# Patient Record
Sex: Female | Born: 1975 | Race: Black or African American | Hispanic: No | Marital: Single | Smoking: Never smoker
Health system: Southern US, Community
[De-identification: ages and names within clinical notes are randomized; demographics above are authoritative.]

## PROBLEM LIST (undated history)

## (undated) DIAGNOSIS — E119 Type 2 diabetes mellitus without complications: Secondary | ICD-10-CM

## (undated) HISTORY — PX: BACK SURGERY: SHX140

---

## 1999-12-30 ENCOUNTER — Inpatient Hospital Stay (HOSPITAL_COMMUNITY): Admission: AD | Admit: 1999-12-30 | Discharge: 1999-12-30 | Payer: Self-pay | Admitting: *Deleted

## 2000-01-12 ENCOUNTER — Encounter: Payer: Self-pay | Admitting: Obstetrics & Gynecology

## 2000-01-12 ENCOUNTER — Inpatient Hospital Stay (HOSPITAL_COMMUNITY): Admission: AD | Admit: 2000-01-12 | Discharge: 2000-01-12 | Payer: Self-pay | Admitting: *Deleted

## 2000-01-12 ENCOUNTER — Inpatient Hospital Stay (HOSPITAL_COMMUNITY): Admission: AD | Admit: 2000-01-12 | Discharge: 2000-01-12 | Payer: Self-pay | Admitting: Obstetrics & Gynecology

## 2000-01-27 ENCOUNTER — Other Ambulatory Visit: Admission: RE | Admit: 2000-01-27 | Discharge: 2000-01-27 | Payer: Self-pay | Admitting: Obstetrics and Gynecology

## 2000-02-06 ENCOUNTER — Inpatient Hospital Stay (HOSPITAL_COMMUNITY): Admission: AD | Admit: 2000-02-06 | Discharge: 2000-02-06 | Payer: Self-pay | Admitting: Obstetrics and Gynecology

## 2000-05-28 ENCOUNTER — Inpatient Hospital Stay (HOSPITAL_COMMUNITY): Admission: AD | Admit: 2000-05-28 | Discharge: 2000-05-28 | Payer: Self-pay | Admitting: Obstetrics and Gynecology

## 2000-05-31 ENCOUNTER — Ambulatory Visit (HOSPITAL_COMMUNITY): Admission: RE | Admit: 2000-05-31 | Discharge: 2000-05-31 | Payer: Self-pay | Admitting: Obstetrics and Gynecology

## 2000-05-31 ENCOUNTER — Encounter: Payer: Self-pay | Admitting: Obstetrics and Gynecology

## 2000-07-05 ENCOUNTER — Inpatient Hospital Stay (HOSPITAL_COMMUNITY): Admission: AD | Admit: 2000-07-05 | Discharge: 2000-07-05 | Payer: Self-pay | Admitting: Obstetrics and Gynecology

## 2000-07-07 ENCOUNTER — Inpatient Hospital Stay (HOSPITAL_COMMUNITY): Admission: AD | Admit: 2000-07-07 | Discharge: 2000-07-09 | Payer: Self-pay | Admitting: Obstetrics and Gynecology

## 2001-07-30 ENCOUNTER — Emergency Department (HOSPITAL_COMMUNITY): Admission: EM | Admit: 2001-07-30 | Discharge: 2001-07-30 | Payer: Self-pay | Admitting: Emergency Medicine

## 2002-10-17 ENCOUNTER — Emergency Department (HOSPITAL_COMMUNITY): Admission: EM | Admit: 2002-10-17 | Discharge: 2002-10-17 | Payer: Self-pay | Admitting: Emergency Medicine

## 2002-10-18 ENCOUNTER — Encounter: Payer: Self-pay | Admitting: Emergency Medicine

## 2002-11-23 ENCOUNTER — Emergency Department (HOSPITAL_COMMUNITY): Admission: EM | Admit: 2002-11-23 | Discharge: 2002-11-23 | Payer: Self-pay | Admitting: Emergency Medicine

## 2005-04-20 ENCOUNTER — Emergency Department (HOSPITAL_COMMUNITY): Admission: EM | Admit: 2005-04-20 | Discharge: 2005-04-20 | Payer: Self-pay | Admitting: Family Medicine

## 2006-04-17 ENCOUNTER — Emergency Department (HOSPITAL_COMMUNITY): Admission: EM | Admit: 2006-04-17 | Discharge: 2006-04-17 | Payer: Self-pay | Admitting: Family Medicine

## 2007-07-01 ENCOUNTER — Emergency Department (HOSPITAL_COMMUNITY): Admission: EM | Admit: 2007-07-01 | Discharge: 2007-07-01 | Payer: Self-pay | Admitting: *Deleted

## 2008-07-30 ENCOUNTER — Ambulatory Visit (HOSPITAL_COMMUNITY): Admission: RE | Admit: 2008-07-30 | Discharge: 2008-08-01 | Payer: Self-pay | Admitting: Orthopedic Surgery

## 2008-07-30 ENCOUNTER — Encounter (INDEPENDENT_AMBULATORY_CARE_PROVIDER_SITE_OTHER): Payer: Self-pay | Admitting: Orthopedic Surgery

## 2009-02-05 ENCOUNTER — Emergency Department (HOSPITAL_COMMUNITY): Admission: EM | Admit: 2009-02-05 | Discharge: 2009-02-05 | Payer: Self-pay | Admitting: Family Medicine

## 2009-02-10 ENCOUNTER — Emergency Department (HOSPITAL_COMMUNITY): Admission: EM | Admit: 2009-02-10 | Discharge: 2009-02-10 | Payer: Self-pay | Admitting: Emergency Medicine

## 2009-02-17 ENCOUNTER — Emergency Department (HOSPITAL_COMMUNITY): Admission: EM | Admit: 2009-02-17 | Discharge: 2009-02-17 | Payer: Self-pay | Admitting: Emergency Medicine

## 2009-12-17 ENCOUNTER — Encounter: Admission: RE | Admit: 2009-12-17 | Discharge: 2009-12-17 | Payer: Self-pay | Admitting: Obstetrics and Gynecology

## 2010-04-11 IMAGING — CT CT CERVICAL SPINE W/O CM
3 of 5 series · 11 of 20 positions shown, 12 images · non-contrast
Comparison: None.

CT HEAD

CLINICAL DATA: Fell - struck right side of head.  Headache with
nausea and vomiting.

CT HEAD WITHOUT CONTRAST
CT CERVICAL SPINE WITHOUT CONTRAST
TECHNIQUE: Multidetector CT imaging of the head and cervical spine
was performed following the standard protocol without intravenous
contrast.  Multiplanar CT image reconstructions of the cervical
spine were also generated.

[Series 7: c_spine 2.0 b31s · axial · 0.23mm/px · z∈[-272,-170]mm · 4 of 85 slices shown]
[im 17/85  bone]
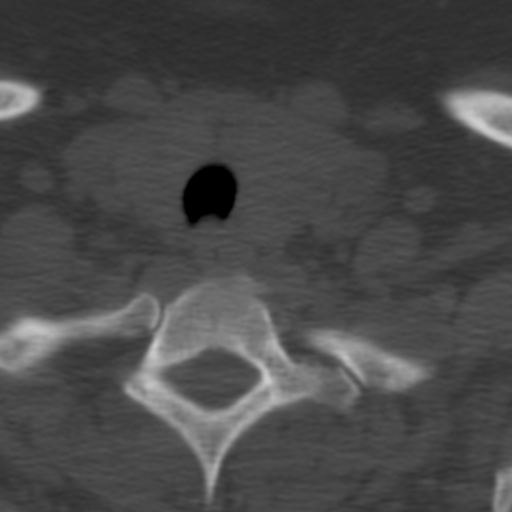
[im 34/85  bone]
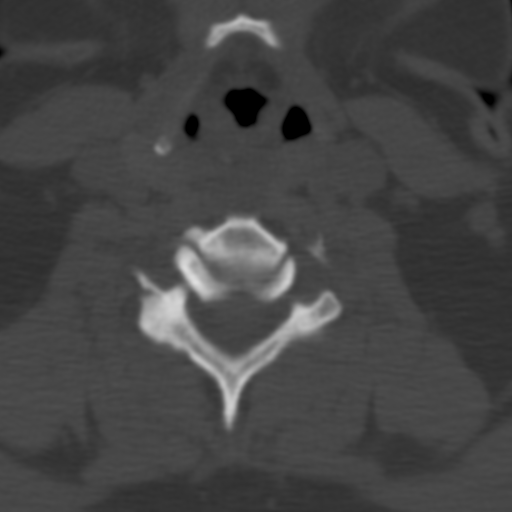
[im 51/85  bone]
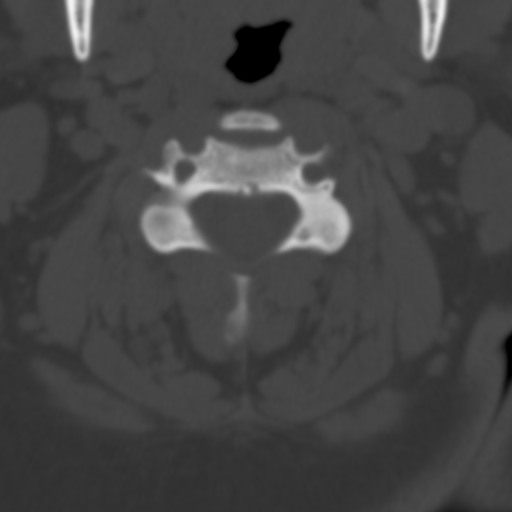
[im 68/85  bone]
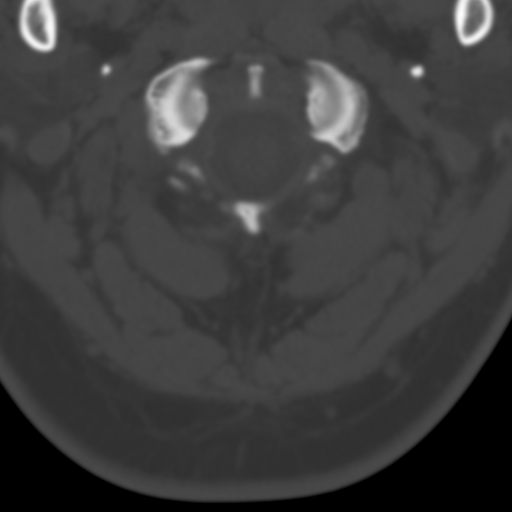

[Series 602: <mpr thick range> · coronal · 0.33mm/px · 3 of 49 slices shown]
[im 10/49  bone]
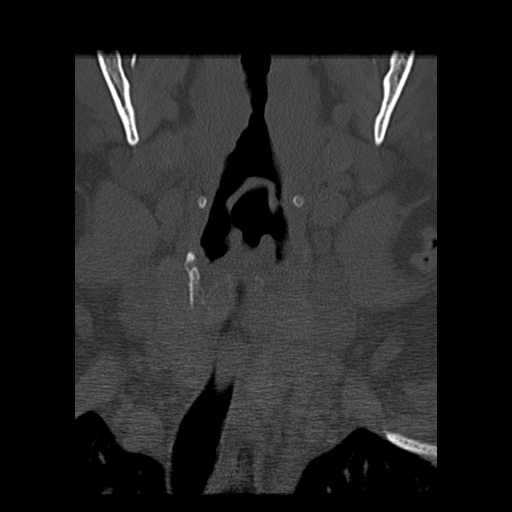
[im 20/49  bone]
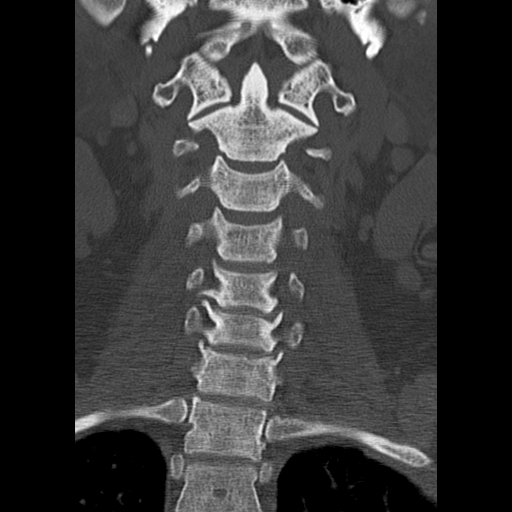
[im 29/49  bone]
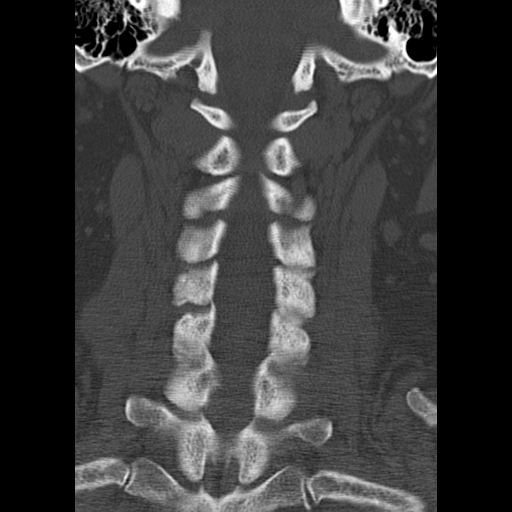

[Series 603: <mpr thick range(1)> · axial · 0.33mm/px · z∈[-321,-233]mm · 4 of 90 slices shown, 5 images]
[im 18/90  soft-tissue]
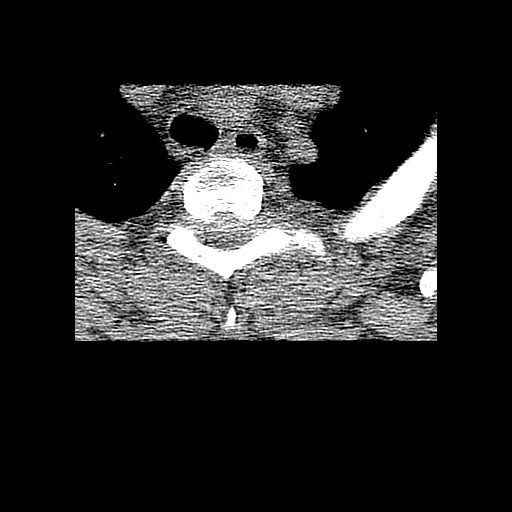
[im 18/90  bone]
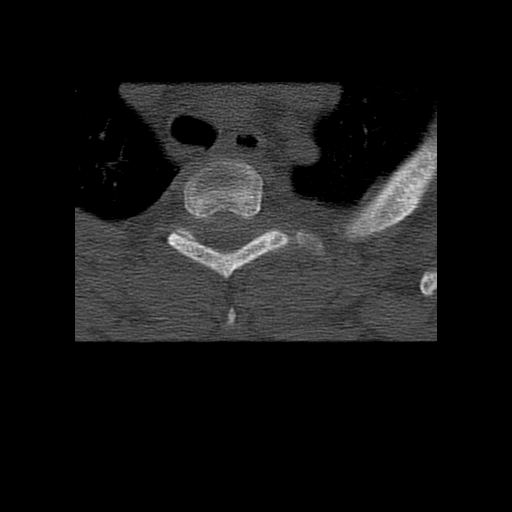
[im 36/90  bone]
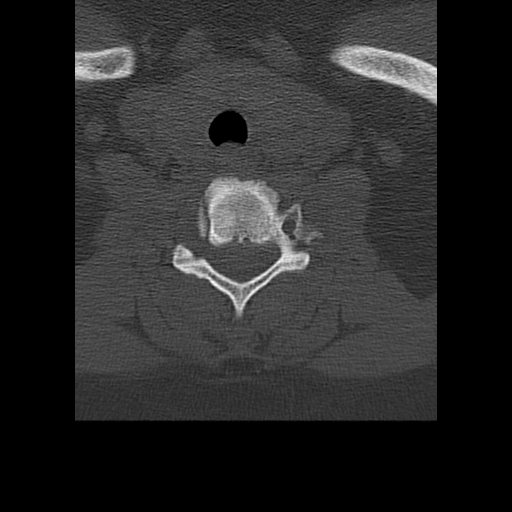
[im 54/90  bone]
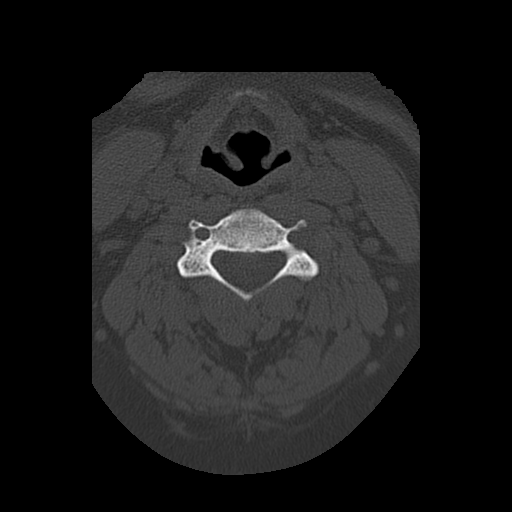
[im 72/90  bone]
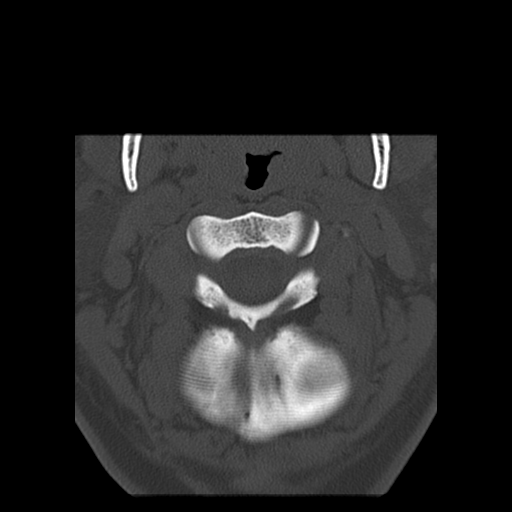

[11 of 20 positions shown; findings below may reference images not displayed]

FINDINGS: Ventricular size and CSF spaces normal.  No evidence for
acute infarct, hemorrhage, or mass lesion. No extra-axial fluid
collections or midline shift.  Calvarium intact.  No fluid in the
sinuses visualized.
IMPRESSION: 1.  No acute or focal intracranial findings.
2.  Retention cyst or polyp of the right sphenoid sinus noted.
There is a retention cyst or polyp in the right sphenoid sinus.

CT CERVICAL SPINE
FINDINGS: There is reversal of the normal lumbar lordotic curve.
There is disc narrowing at C5-6 with degenerative spurs.  No spinal
or foraminal stenosis.

No fractures or subluxation.  Paraspinous soft tissues normal.  And

No obvious acute disc herniation or intraspinal hematoma.
IMPRESSION: Reversal of lordosis and degenerative changes - no acute findings.

## 2010-05-01 LAB — DIFFERENTIAL
Basophils Absolute: 0 10*3/uL (ref 0.0–0.1)
Basophils Relative: 0 % (ref 0–1)
Eosinophils Absolute: 0.3 10*3/uL (ref 0.0–0.7)
Monocytes Relative: 7 % (ref 3–12)
Neutro Abs: 6.1 10*3/uL (ref 1.7–7.7)
Neutrophils Relative %: 59 % (ref 43–77)

## 2010-05-01 LAB — URINE MICROSCOPIC-ADD ON

## 2010-05-01 LAB — POCT I-STAT, CHEM 8
Chloride: 104 mEq/L (ref 96–112)
Glucose, Bld: 115 mg/dL — ABNORMAL HIGH (ref 70–99)
HCT: 38 % (ref 36.0–46.0)
Hemoglobin: 12.9 g/dL (ref 12.0–15.0)
Potassium: 3.7 mEq/L (ref 3.5–5.1)
Sodium: 137 mEq/L (ref 135–145)

## 2010-05-01 LAB — URINALYSIS, ROUTINE W REFLEX MICROSCOPIC
Hgb urine dipstick: NEGATIVE
Protein, ur: NEGATIVE mg/dL
Specific Gravity, Urine: 1.013 (ref 1.005–1.030)
Urobilinogen, UA: 0.2 mg/dL (ref 0.0–1.0)

## 2010-05-01 LAB — CBC
MCHC: 32.7 g/dL (ref 30.0–36.0)
MCV: 78.4 fL (ref 78.0–100.0)
Platelets: 410 10*3/uL — ABNORMAL HIGH (ref 150–400)
RBC: 4.43 MIL/uL (ref 3.87–5.11)
RDW: 16.4 % — ABNORMAL HIGH (ref 11.5–15.5)

## 2010-05-16 LAB — URINALYSIS, ROUTINE W REFLEX MICROSCOPIC
Bilirubin Urine: NEGATIVE
Glucose, UA: NEGATIVE mg/dL
Hgb urine dipstick: NEGATIVE
Ketones, ur: NEGATIVE mg/dL
Protein, ur: NEGATIVE mg/dL

## 2010-05-16 LAB — POCT I-STAT, CHEM 8
BUN: 8 mg/dL (ref 6–23)
BUN: 4 mg/dL — ABNORMAL LOW (ref 6–23)
Calcium, Ion: 1.16 mmol/L (ref 1.12–1.32)
Calcium, Ion: 1.16 mmol/L (ref 1.12–1.32)
Chloride: 106 mEq/L (ref 96–112)
Chloride: 104 mEq/L (ref 96–112)
Creatinine, Ser: 0.3 mg/dL — ABNORMAL LOW (ref 0.4–1.2)
Glucose, Bld: 95 mg/dL (ref 70–99)
Glucose, Bld: 114 mg/dL — ABNORMAL HIGH (ref 70–99)
HCT: 42 % (ref 36.0–46.0)
Potassium: 3.8 mEq/L (ref 3.5–5.1)

## 2010-05-16 LAB — POCT URINALYSIS DIP (DEVICE)
Glucose, UA: NEGATIVE mg/dL
Hgb urine dipstick: NEGATIVE
Protein, ur: NEGATIVE mg/dL
Specific Gravity, Urine: 1.02 (ref 1.005–1.030)

## 2010-05-16 LAB — DIFFERENTIAL
Basophils Relative: 1 % (ref 0–1)
Eosinophils Absolute: 0.3 10*3/uL (ref 0.0–0.7)
Eosinophils Relative: 4 % (ref 0–5)
Lymphs Abs: 3.3 10*3/uL (ref 0.7–4.0)
Monocytes Relative: 9 % (ref 3–12)

## 2010-05-16 LAB — CBC
HCT: 38.2 % (ref 36.0–46.0)
HCT: 35.5 % — ABNORMAL LOW (ref 36.0–46.0)
Hemoglobin: 12.4 g/dL (ref 12.0–15.0)
MCHC: 32.2 g/dL (ref 30.0–36.0)
MCV: 78.8 fL (ref 78.0–100.0)
RBC: 4.84 MIL/uL (ref 3.87–5.11)
RBC: 4.5 MIL/uL (ref 3.87–5.11)
RDW: 17.1 % — ABNORMAL HIGH (ref 11.5–15.5)
WBC: 13.1 10*3/uL — ABNORMAL HIGH (ref 4.0–10.5)
WBC: 9.5 10*3/uL (ref 4.0–10.5)

## 2010-05-16 LAB — TROPONIN I

## 2010-05-16 LAB — CK TOTAL AND CKMB (NOT AT ARMC): Relative Index: 1.3 (ref 0.0–2.5)

## 2010-05-16 LAB — POCT PREGNANCY, URINE

## 2010-05-23 LAB — COMPREHENSIVE METABOLIC PANEL
ALT: 22 U/L (ref 0–35)
AST: 24 U/L (ref 0–37)
Albumin: 3.4 g/dL — ABNORMAL LOW (ref 3.5–5.2)
Alkaline Phosphatase: 82 U/L (ref 39–117)
Chloride: 103 mEq/L (ref 96–112)
Creatinine, Ser: 0.7 mg/dL (ref 0.4–1.2)
GFR calc Af Amer: >60 mL/min (ref >60)
Potassium: 3.5 mEq/L (ref 3.5–5.1)
Sodium: 138 mEq/L (ref 135–145)
Total Bilirubin: 0.3 mg/dL (ref 0.3–1.2)

## 2010-05-23 LAB — URINALYSIS, ROUTINE W REFLEX MICROSCOPIC
Bilirubin Urine: NEGATIVE
Nitrite: NEGATIVE
Specific Gravity, Urine: 1.029 (ref 1.005–1.030)
pH: 6 (ref 5.0–8.0)

## 2010-05-23 LAB — CBC
Hemoglobin: 11.4 g/dL — ABNORMAL LOW (ref 12.0–15.0)
MCHC: 31.8 g/dL (ref 30.0–36.0)
Platelets: 421 10*3/uL — ABNORMAL HIGH (ref 150–400)
RDW: 18.9 % — ABNORMAL HIGH (ref 11.5–15.5)

## 2010-05-23 LAB — DIFFERENTIAL
Basophils Absolute: 0.1 10*3/uL (ref 0.0–0.1)
Eosinophils Absolute: 0 10*3/uL (ref 0.0–0.7)
Eosinophils Relative: 0 % (ref 0–5)
Lymphocytes Relative: 34 % (ref 12–46)
Monocytes Absolute: 0.9 10*3/uL (ref 0.1–1.0)

## 2010-05-23 LAB — TYPE AND SCREEN
ABO/RH(D): A POS
Antibody Screen: NEGATIVE

## 2010-05-23 LAB — URINE MICROSCOPIC-ADD ON

## 2010-05-23 LAB — PREGNANCY, URINE: Preg Test, Ur: NEGATIVE

## 2010-06-28 NOTE — Op Note (Signed)
Valerie Randolph, Valerie Randolph            ACCOUNT NO.:  [id number]   MEDICAL RECORD NO.:  [id number]          PATIENT TYPE:  OIB   LOCATION:  0098                         FACILITY:  [facility]   PHYSICIAN:  Georges Lynch. Gioffre, M.D.DATE OF BIRTH:  September 12, 1975   DATE OF PROCEDURE:  07/30/2008  DATE OF DISCHARGE:                               OPERATIVE REPORT   PREOPERATIVE DIAGNOSES:  1. 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  2. 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  All      her pain was in her right leg preoperatively.   SURGEON:  Dr. Georges Lynch. Gioffre.   ASSISTANT:  Dr. Marlowe Kays.   PROCEDURE:  1. Decompressive lumbar laminectomy at L5-S1 on the right for lateral      recess stenosis.  2. Foraminotomies at L5-S1 on the right.  3. Microdiskectomy at L5-S1 on the right for an extremely large      central and to the right herniated lumbar disk.   DESCRIPTION OF PROCEDURE:  Under general anesthesia with the patient on  the spinal frame, a routine orthopedic prep and draping the lower back  was carried out.  She had 2 grams of IV Ancef preop.  Two needles were  placed in the back for localization purposes.  An x-ray was taken.  At  this time an incision was made over the L5-S1 interspace.  Bleeders  identified and cauterized.  The muscle was stripped from both sides of  the spinous process and lamina.  I then went down and identified the  sacrum and the L5-S1 space. Self-retaining McCullough retractors were  inserted.  Bleeders were identified and cauterized.  At this time  another x-ray was taken to verify the exact position.  I then carried  out a hemilaminectomy at L5-S1 in the usual fashion.  I went far out and  decompressed the lateral recess with regards to the stenosis.  Note, she  was extremely large and weighed 260 pounds.  I first went out and  identified the excessively-large lateral recess veins and cauterized  those.  Note that this was done  after we identified the S1 root.  We  went down and did a nice foraminotomy, to decompress the root first.  We  then went up more proximally and identified the L5-S1 disk on the right.  There was an extremely large disk herniation.  A cruciate incision was  made in the posterior longitudinal ligament.  I then utilized the nerve  hook and Epstein curettes and teased the ala.  An extremely large  fragment of disk that literally was hard in portions and soft in  portions.  Following that I went down into the disk space which was  degenerative and I made multiple passes in the space, and also  subligamentous to remove the remaining part of the disk.  Epstein  curettes were taken across the midline and made sure we had a thorough  decompression.  Note, she did have some POSTERIOR SPURRING over the  vertebrate.  The root now was extremely free.  The dura was free.  We  were able to easily pass a hockey-stick in all direction.  We thoroughly  irrigated the area out.  I injected 10 mL of FloSeal at the operative  site.  I then closed the wounds in layers in the usual fashion, but I  did leave a small deep distal and proximal part of the wounds open for  drainage purposes.  I then inserted some thrombin-soaked Gelfoam between  the muscles at the time we closed  these wounds.  the subcu was closed with 0 Vicryl, the skin with metal  staples.  Sterile Neosporin dressing was applied.   The patient left the operative in satisfactory condition.           ______________________________  Georges Lynch Valerie Randolph, M.D.     RAG/MEDQ  D:  07/30/2008  T:  07/30/2008  Job:  [id number]   cc:   Emeterio Reeve, MD  Fax: [phone]
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
## 2010-11-09 LAB — POCT URINALYSIS DIP (DEVICE)
Ketones, ur: NEGATIVE
Protein, ur: NEGATIVE
Specific Gravity, Urine: 1.01
pH: 6.5

## 2012-09-23 ENCOUNTER — Encounter (HOSPITAL_COMMUNITY): Payer: Self-pay | Admitting: Emergency Medicine

## 2012-09-23 ENCOUNTER — Emergency Department (INDEPENDENT_AMBULATORY_CARE_PROVIDER_SITE_OTHER)
Admission: EM | Admit: 2012-09-23 | Discharge: 2012-09-23 | Disposition: A | Discharge status: Home / Self Care | Payer: 59 | Source: Home / Self Care

## 2012-09-23 DIAGNOSIS — R109 Unspecified abdominal pain: Secondary | ICD-10-CM

## 2012-09-23 LAB — POCT URINALYSIS DIP (DEVICE)
Ketones, ur: NEGATIVE mg/dL
Protein, ur: NEGATIVE mg/dL
Specific Gravity, Urine: >=1.03 (ref 1.005–1.030)
pH: 6 (ref 5.0–8.0)

## 2012-09-23 NOTE — ED Notes (Signed)
C/o abd pain which started today at 5 pm.  Patient states the pain felt like pins scraping at her stomach and she wasn't able to move.

## 2012-09-23 NOTE — ED Provider Notes (Signed)
Valerie Randolph is a 37 y.o. female who presents to Urgent Care today for abdominal pain. Patient had episode of intense abdominal pain following a bowel movement this evening at about 5:00. It lasted about 20 minutes and has subsided. She denies any blood in her stool nausea vomiting or diarrhea. She is not constipated. It was a normal bowel movement. She has a similar episode about 2 weeks ago that lasted about 20 minutes. She feels well is without any dysuria. She denies any vaginal discharge.  She has not tried any medications.    PMH reviewed. Diabetes History  Substance Use Topics  . Smoking status: Not on file  . Smokeless tobacco: Not on file  . Alcohol Use: Not on file   ROS as above Medications reviewed. No current facility-administered medications for this encounter.   No current outpatient prescriptions on file.    Exam:  BP 136/70  Pulse 71  Temp(Src) 99.7 F (37.6 C) (Oral)  Resp 18  SpO2 97%  LMP 08/09/2012 Gen: Well NAD HEENT: EOMI,  MMM Lungs: CTABL Nl WOB Heart: RRR no MRG Abd: NABS, NT, ND no rebound or guarding Exts: Non edematous BL  LE, warm and well perfused.  (Patient declined a pelvic exam)   Results for orders placed during the hospital encounter of 09/23/12 (from the past 24 hour(s))  POCT PREGNANCY, URINE     Status: None   Collection Time    09/23/12  7:10 PM      Result Value Range   Preg Test, Ur NEGATIVE  NEGATIVE  POCT URINALYSIS DIP (DEVICE)     Status: Abnormal   Collection Time    09/23/12  7:10 PM      Result Value Range   Glucose, UA NEGATIVE  NEGATIVE mg/dL   Bilirubin Urine NEGATIVE  NEGATIVE   Ketones, ur NEGATIVE  NEGATIVE mg/dL   Specific Gravity, Urine >=1.030  1.005 - 1.030   Hgb urine dipstick NEGATIVE  NEGATIVE   pH 6.0  5.0 - 8.0   Protein, ur NEGATIVE  NEGATIVE mg/dL   Urobilinogen, UA 1.0  0.0 - 1.0 mg/dL   Nitrite NEGATIVE  NEGATIVE   Leukocytes, UA SMALL (*) NEGATIVE   No results found.  Assessment and  Plan: 37 y.o. female with abdominal pain following a bowel movement now resolved. Urinary tract infection is doubtful. Patient does not have significant symptoms. We'll obtain a urine culture and defer treatment.  Likely spasm or peristalsis following a bowel movement. Patient declined a pelvic exam. She states she will followup with her OB/GYN in several days for further evaluation and management of this problem.       Rodolph Bong, MD 09/23/12 [phone]

## 2018-01-30 ENCOUNTER — Emergency Department (HOSPITAL_COMMUNITY)
Admission: EM | Admit: 2018-01-30 | Discharge: 2018-01-30 | Disposition: A | Discharge status: Home / Self Care | Payer: Medicaid Other | Attending: Emergency Medicine | Admitting: Emergency Medicine

## 2018-01-30 ENCOUNTER — Encounter (HOSPITAL_COMMUNITY): Payer: Self-pay | Admitting: *Deleted

## 2018-01-30 ENCOUNTER — Other Ambulatory Visit: Payer: Self-pay

## 2018-01-30 DIAGNOSIS — E119 Type 2 diabetes mellitus without complications: Secondary | ICD-10-CM | POA: Diagnosis not present

## 2018-01-30 DIAGNOSIS — R509 Fever, unspecified: Secondary | ICD-10-CM | POA: Insufficient documentation

## 2018-01-30 DIAGNOSIS — J029 Acute pharyngitis, unspecified: Secondary | ICD-10-CM | POA: Diagnosis present

## 2018-01-30 HISTORY — DX: Type 2 diabetes mellitus without complications: E11.9

## 2018-01-30 LAB — GROUP A STREP BY PCR: GROUP A STREP BY PCR: NOT DETECTED

## 2018-01-30 MED ORDER — PENICILLIN G BENZATHINE 1200000 UNIT/2ML IM SUSP
1.2000 10*6.[IU] | Freq: Once | INTRAMUSCULAR | Status: AC
  Administered 2018-01-30: 1.2 10*6.[IU] via INTRAMUSCULAR
  Filled 2018-01-30: qty 2

## 2018-01-30 MED ORDER — NAPROXEN 500 MG PO TABS
500.0000 mg | ORAL_TABLET | Freq: Once | ORAL | Status: AC
  Administered 2018-01-30: 500 mg via ORAL
  Filled 2018-01-30: qty 1

## 2018-01-30 MED ORDER — DEXAMETHASONE SODIUM PHOSPHATE 10 MG/ML IJ SOLN
10.0000 mg | Freq: Once | INTRAMUSCULAR | Status: AC
  Administered 2018-01-30: 10 mg via INTRAMUSCULAR
  Filled 2018-01-30: qty 1

## 2018-01-30 NOTE — ED Triage Notes (Signed)
Pt says that last week she had body aches, fever and congestion. Woke up this morning with sore throat and 1 episode of vomiting.

## 2018-01-30 NOTE — Discharge Instructions (Signed)
Please read and follow all provided instructions.  Your diagnoses today include:  1. Acute pharyngitis, unspecified etiology     Tests performed today include:  Strep test: was negative for strep throat  Vital signs. See below for your results today.   Medications prescribed:   None  Home care instructions:  Please read the educational materials provided and follow any instructions contained in this packet.  Follow-up instructions: Please follow-up with your primary care provider as needed for further evaluation of your symptoms.  Return instructions:   Please return to the Emergency Department if you experience worsening symptoms.   Return if you have worsening problems swallowing, your neck becomes swollen, you cannot swallow your saliva or your voice becomes muffled.   Return with high persistent fever, persistent vomiting, or if you have trouble breathing.   Please return if you have any other emergent concerns.  Additional Information:  Your vital signs today were: BP (!) 146/92    Pulse (!) 106    Temp 98.3 F (36.8 C) (Oral)    Resp 18    Ht 5\' 10"  (1.778 m)    Wt 104.3 kg    LMP  (Within Weeks)    SpO2 98%    BMI 33.00 kg/m  If your blood pressure (BP) was elevated above 135/85 this visit, please have this repeated by your doctor within one month. --------------

## 2018-01-30 NOTE — ED Provider Notes (Signed)
[facility]-EMERGENCY DEPT Provider Note   CSN: [id number] Arrival date & time: 01/30/18  [id number]     History   Chief Complaint Chief Complaint  Patient presents with  . Sore Throat    HPI Valerie Randolph is a 42 y.o. female.  Patient with h/o DM -- reports flulike symptoms including fever, body aches, cough, sore throat starting over the past 4 days.  Patient has developed fever and worsening sore throat pain prompting emergency department visit this morning.  Fever to 100 F at home.  She has been taking TheraFlu with some temporary relief.  She vomited this morning.  No diarrhea or urinary symptoms.  She is able to drink however is painful.  The onset of this condition was acute. The course is constant. Aggravating factors: swallowing. Alleviating factors: none.       Past Medical History:  Diagnosis Date  . Diabetes mellitus without complication (HCC)     There are no active problems to display for this patient.   Past Surgical History:  Procedure Laterality Date  . BACK SURGERY       OB History   No obstetric history on file.      Home Medications    Prior to Admission medications   Not on File    Family History No family history on file.  Social History Social History   Tobacco Use  . Smoking status: Never Smoker  Substance Use Topics  . Alcohol use: Never    Frequency: Never  . Drug use: Never     Allergies   Patient has no known allergies.   Review of Systems Review of Systems  Constitutional: Positive for chills, fatigue and fever.  HENT: Positive for congestion, sore throat and trouble swallowing. Negative for ear pain, rhinorrhea and sinus pressure.   Eyes: Negative for redness.  Respiratory: Positive for cough. Negative for wheezing.   Gastrointestinal: Positive for nausea and vomiting. Negative for abdominal pain and diarrhea.  Genitourinary: Negative for dysuria.  Musculoskeletal: Positive for myalgias.  Negative for neck stiffness.  Skin: Negative for rash.  Neurological: Negative for headaches.  Hematological: Positive for adenopathy.     Physical Exam Updated Vital Signs BP (!) 146/92   Pulse (!) 106   Temp 98.3 F (36.8 C) (Oral)   Resp 18   Ht 5\' 10"  (1.778 m)   Wt 104.3 kg   LMP  (Within Weeks)   SpO2 98%   BMI 33.00 kg/m   Physical Exam Vitals signs and nursing note reviewed.  Constitutional:      Appearance: She is well-developed.  HENT:     Head: Normocephalic and atraumatic.     Right Ear: Tympanic membrane, ear canal and external ear normal.     Left Ear: Tympanic membrane, ear canal and external ear normal.     Nose: Nose normal.     Mouth/Throat:     Mouth: Mucous membranes are moist.     Pharynx: Pharyngeal swelling, oropharyngeal exudate and posterior oropharyngeal erythema present.     Tonsils: Tonsillar exudate present. No tonsillar abscesses. Swelling: 3+ on the right. 3+ on the left.  Eyes:     General:        Right eye: No discharge.        Left eye: No discharge.     Conjunctiva/sclera: Conjunctivae normal.  Neck:     Musculoskeletal: Normal range of motion and neck supple.  Cardiovascular:     Rate and Rhythm: Normal  rate and regular rhythm.     Heart sounds: Normal heart sounds.  Pulmonary:     Effort: Pulmonary effort is normal.     Breath sounds: Normal breath sounds.  Abdominal:     Palpations: Abdomen is soft.     Tenderness: There is no abdominal tenderness.  Lymphadenopathy:     Cervical: Cervical adenopathy present.  Skin:    General: Skin is warm and dry.  Neurological:     Mental Status: She is alert.      ED Treatments / Results  Labs (all labs ordered are listed, but only abnormal results are displayed) Labs Reviewed  GROUP A STREP BY PCR    EKG None  Radiology No results found.  Procedures Procedures (including critical care time)  Medications Ordered in ED Medications  penicillin g benzathine (BICILLIN  LA) 1200000 UNIT/2ML injection 1.2 Million Units (1.2 Million Units Intramuscular Given 01/30/18 0740)  dexamethasone (DECADRON) injection 10 mg (10 mg Intramuscular Given 01/30/18 0740)  naproxen (NAPROSYN) tablet 500 mg (500 mg Oral Given 01/30/18 0746)     Initial Impression / Assessment and Plan / ED Course  I have reviewed the triage vital signs and the nursing notes.  Pertinent labs & imaging results that were available during my care of the patient were reviewed by me and considered in my medical decision making (see chart for details).     Patient seen and examined.  Patient with flulike symptoms with superimposed pharyngitis.  Strep test is negative.  Patient be treated with IM Bicillin, IM Decadron.  Vital signs reviewed and are as follows: BP (!) 146/92   Pulse (!) 106   Temp 98.3 F (36.8 C) (Oral)   Resp 18   Ht 5\' 10"  (1.778 m)   Wt 104.3 kg   LMP  (Within Weeks)   SpO2 98%   BMI 33.00 kg/m   7:49 AM Patient counseled on supportive care for pharyngitis and s/s to return including worsening symptoms, persistent fever, persistent vomiting, or if they have any other concerns. Urged to see PCP if symptoms persist for more than 3 days. Patient verbalizes understanding and agrees with plan.    Final Clinical Impressions(s) / ED Diagnoses   Final diagnoses:  Acute pharyngitis, unspecified etiology   Patient with several days of generalized infection symptoms, now with worsening sore throat.  Exam consistent with pharyngitis.  Patient was treated with Decadron, naproxen, IM Bicillin.  No signs of peritonsillar abscess.  Patient with good range of motion of the neck and is tolerating her secretions.  Low concern for deep space infection at this time.  Patient appears well, nontoxic.  Comfortable with discharge to home with supportive treatment and rest.  ED Discharge Orders    None       Renne Crigler, PA-C 01/30/18 0750    Alvira Monday, MD 01/31/18 1116

## 2018-01-30 NOTE — ED Notes (Signed)
Dry heaves in triage with thick mucous production.

## 2018-01-30 NOTE — ED Notes (Signed)
Bed: WA07 Expected date:  Expected time:  Means of arrival:  Comments: 

## 2018-10-03 ENCOUNTER — Emergency Department (HOSPITAL_COMMUNITY)
Admission: EM | Admit: 2018-10-03 | Discharge: 2018-10-03 | Disposition: A | Discharge status: Home / Self Care | Payer: BC Managed Care – PPO | Attending: Emergency Medicine | Admitting: Emergency Medicine

## 2018-10-03 ENCOUNTER — Emergency Department (HOSPITAL_COMMUNITY): Payer: BC Managed Care – PPO

## 2018-10-03 ENCOUNTER — Encounter (HOSPITAL_COMMUNITY): Payer: Self-pay | Admitting: Emergency Medicine

## 2018-10-03 ENCOUNTER — Other Ambulatory Visit: Payer: Self-pay

## 2018-10-03 DIAGNOSIS — R0789 Other chest pain: Secondary | ICD-10-CM | POA: Diagnosis not present

## 2018-10-03 DIAGNOSIS — Z20828 Contact with and (suspected) exposure to other viral communicable diseases: Secondary | ICD-10-CM | POA: Diagnosis not present

## 2018-10-03 DIAGNOSIS — E119 Type 2 diabetes mellitus without complications: Secondary | ICD-10-CM | POA: Insufficient documentation

## 2018-10-03 DIAGNOSIS — R079 Chest pain, unspecified: Secondary | ICD-10-CM | POA: Diagnosis present

## 2018-10-03 LAB — CBC
HCT: 41.3 % (ref 36.0–46.0)
Hemoglobin: 12.5 g/dL (ref 12.0–15.0)
MCH: 26.5 pg (ref 26.0–34.0)
MCHC: 30.3 g/dL (ref 30.0–36.0)
MCV: 87.7 fL (ref 80.0–100.0)
Platelets: 427 10*3/uL — ABNORMAL HIGH (ref 150–400)
RBC: 4.71 MIL/uL (ref 3.87–5.11)
RDW: 14.1 % (ref 11.5–15.5)
WBC: 13.5 10*3/uL — ABNORMAL HIGH (ref 4.0–10.5)
nRBC: 0 % (ref 0.0–0.2)

## 2018-10-03 LAB — D-DIMER, QUANTITATIVE: D-Dimer, Quant: <0.27 ug/mL-FEU (ref 0.00–0.50)

## 2018-10-03 LAB — BASIC METABOLIC PANEL
Anion gap: 10 (ref 5–15)
BUN: 10 mg/dL (ref 6–20)
CO2: 25 mmol/L (ref 22–32)
Calcium: 9.1 mg/dL (ref 8.9–10.3)
Chloride: 102 mmol/L (ref 98–111)
Creatinine, Ser: 0.6 mg/dL (ref 0.44–1.00)
GFR calc Af Amer: >60 mL/min (ref >60)
GFR calc non Af Amer: >60 mL/min (ref >60)
Glucose, Bld: 138 mg/dL — ABNORMAL HIGH (ref 70–99)
Potassium: 3.8 mmol/L (ref 3.5–5.1)
Sodium: 137 mmol/L (ref 135–145)

## 2018-10-03 LAB — TROPONIN I (HIGH SENSITIVITY)
Troponin I (High Sensitivity): 3 ng/L (ref <18)
Troponin I (High Sensitivity): 2.75 ng/L (ref <18)

## 2018-10-03 LAB — I-STAT BETA HCG BLOOD, ED (NOT ORDERABLE): I-stat hCG, quantitative: <5 m[IU]/mL (ref <5)

## 2018-10-03 MED ORDER — SODIUM CHLORIDE 0.9% FLUSH: 3.0000 mL | Freq: Once | INTRAVENOUS | Status: DC

## 2018-10-03 MED ORDER — ASPIRIN 325 MG PO TABS
ORAL_TABLET | ORAL | Status: AC
  Filled 2018-10-03: qty 1

## 2018-10-03 NOTE — ED Provider Notes (Signed)
[facility]-EMERGENCY DEPT Provider Note   CSN: [id number] Arrival date & time: 10/03/18  0032     History   Chief Complaint Chief Complaint  Patient presents with  . Chest Pain  . Numbness    HPI Seven A Judithann SheenWhitsett is a 43 y.o. female.     43 y.o female with a PMH of DM presents to the ED with a chief complaint of chest pain.  Patient reports she is felt this constant pressure all over her chest with some radiation into her left arm.  She reports this pain is worse with deep inspiration and better with Tylenol.  She states she began feeling some left arm numbness yesterday during this episode of chest pain, states this has now gone away.  She has taken some Tylenol, Gas-X with some improvement in symptoms.  She also endorses nausea at baseline but has not had any episodes of vomiting.  She denies any previous history of blood clots, CAD.  Denies any fevers, shortness of breath.  The history is provided by the patient and medical records.    Past Medical History:  Diagnosis Date  . Diabetes mellitus without complication (HCC)     There are no active problems to display for this patient.   Past Surgical History:  Procedure Laterality Date  . BACK SURGERY       OB History   No obstetric history on file.      Home Medications    Prior to Admission medications   Not on File    Family History No family history on file.  Social History Social History   Tobacco Use  . Smoking status: Never Smoker  . Smokeless tobacco: Never Used  Substance Use Topics  . Alcohol use: Never    Frequency: Never  . Drug use: Never     Allergies   Patient has no known allergies.   Review of Systems Review of Systems  Constitutional: Negative for chills and fever.  HENT: Negative for ear pain and sore throat.   Eyes: Negative for pain and visual disturbance.  Respiratory: Negative for cough and shortness of breath.   Cardiovascular: Positive for  chest pain. Negative for palpitations.  Gastrointestinal: Positive for nausea. Negative for abdominal pain and vomiting.  Genitourinary: Negative for dysuria and hematuria.  Musculoskeletal: Negative for arthralgias and back pain.  Skin: Negative for color change and rash.  Neurological: Negative for seizures and syncope.  All other systems reviewed and are negative.    Physical Exam Updated Vital Signs BP 136/69   Pulse 71   Temp 98.9 F (37.2 C) (Oral)   Resp 17   LMP 09/24/2018   SpO2 99%   Physical Exam Vitals signs and nursing note reviewed.  Constitutional:      General: She is not in acute distress.    Appearance: She is well-developed.     Comments: Non-ill-appearing.  HENT:     Head: Normocephalic and atraumatic.     Mouth/Throat:     Pharynx: No oropharyngeal exudate.  Eyes:     Pupils: Pupils are equal, round, and reactive to light.  Neck:     Musculoskeletal: Normal range of motion.  Cardiovascular:     Rate and Rhythm: Regular rhythm.     Heart sounds: Normal heart sounds.     Comments: No bilateral leg pitting edema. Pulmonary:     Effort: Pulmonary effort is normal. No respiratory distress.     Breath sounds: Normal breath sounds.  Comments: Lungs are clear to auscultation.  No rhonchi, wheezing, rales. Chest:     Comments: No tenderness to palpation along the chest wall region. Abdominal:     General: Bowel sounds are normal. There is no distension.     Palpations: Abdomen is soft.     Tenderness: There is no abdominal tenderness.  Musculoskeletal:        General: No tenderness or deformity.     Right lower leg: No edema.     Left lower leg: No edema.  Skin:    General: Skin is warm and dry.  Neurological:     Mental Status: She is alert and oriented to person, place, and time.      ED Treatments / Results  Labs (all labs ordered are listed, but only abnormal results are displayed) Labs Reviewed  BASIC METABOLIC PANEL - Abnormal;  Notable for the following components:      Result Value   Glucose, Bld 138 (*)    All other components within normal limits  CBC - Abnormal; Notable for the following components:   WBC 13.5 (*)    Platelets 427 (*)    All other components within normal limits  I-STAT BETA HCG BLOOD, ED ([facility], [facility], [facility] ONLY)  I-STAT BETA HCG BLOOD, ED (NOT ORDERABLE)  TROPONIN I (HIGH SENSITIVITY)  TROPONIN I (HIGH SENSITIVITY)    EKG EKG Interpretation  Date/Time:  Thursday October 03 2018 00:41:11 EDT Ventricular Rate:  76 PR Interval:    QRS Duration: 87 QT Interval:  380 QTC Calculation: 428 R Axis:   64 Text Interpretation:  Sinus rhythm Normal ECG No significant change was found Confirmed by Molpus, John [phone]) on 10/03/2018 1:00:37 AM   Radiology No results found.  Procedures Procedures (including critical care time)  Medications Ordered in ED Medications  aspirin 325 MG tablet (has no administration in time range)     Initial Impression / Assessment and Plan / ED Course  I have reviewed the triage vital signs and the nursing notes.  Pertinent labs & imaging results that were available during my care of the patient were reviewed by me and considered in my medical decision making (see chart for details).    Patient with a past medical history of diabetes presents to the ED with complaints of chest pain along with nausea and left arm numbness for the past 3 days.  Patient reports is a constant pain.  Has not had any previous episodes in the past.  CBC was remarkable for a slight leukocytosis at 13.5, hemoglobin is within normal limits.  BMP showed no electrolyte derangement, creatinine level is within normal limits.  Chest x-ray did not show any consolidation, pneumothorax, pleural effusion.  An EKG was obtained which showed no changes consistent with STEMI or infarct.  Patient was given aspirin for relieving her chest pain.  A d-dimer was also ordered as patient reported pain with deep  inspiration however there was no tachycardia or hypoxia on her exam.  D-dimer was negative.  Low suspicion for ACS as pain is improved with Tylenol along with ibuprofen.  Her vitals have been stable during ED visit.  She does not have any previous history of CAD.  I have advised patient that she ultimately will need to schedule an appointment with PCP in order to obtain a stress test.  She does not have any fevers, sore throat, consolidation on her chest x-ray low suspicion for any viral URI.  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  Does report she has a small child at home and due to her comorbidities would like to obtain this testing today.  I will personally order COVID-19 swab for a 48-hour send out. I have discussed results with patient, her second troponin has also been negative.  We will have her follow-up in outpatient basis with a stress test.  Patient understands and agrees with management.  Return precautions discussed at length.   Portions of this note were generated with Scientist, clinical (histocompatibility and immunogenetics)Dragon dictation software. Dictation errors may occur despite best attempts at proofreading. Final Clinical Impressions(s) / ED Diagnoses   Final diagnoses:  Atypical chest pain    ED Discharge Orders    None       Claude MangesSoto, Yuki Purves, PA-C 10/03/18 [id number]    Paula LibraMolpus, John, MD 10/03/18 [phone]

## 2018-10-03 NOTE — ED Triage Notes (Signed)
Patient here from home with complaints of chest pain x3 days and left arm numbness. Denies n/v.

## 2018-10-03 NOTE — Discharge Instructions (Addendum)
A COVID-19 test was obtained during your visit today, please follow-up with results which will be available via MyChart within 48 hours.  Your laboratory results today were within normal limits.  Please schedule an appointment with your primary care physician in order to obtain a stress test.  If you experience any shortness of breath, chest pain, worsening symptoms please return to the emergency department.

## 2018-10-03 NOTE — ED Notes (Addendum)
Pt was verbalized discharge instructions. Pt had no further questions at this time. NAD. 

## 2018-10-03 NOTE — ED Notes (Signed)
Spoke with [employer] from lab. Adding D-dimer on

## 2018-10-04 LAB — NOVEL CORONAVIRUS, NAA (HOSP ORDER, SEND-OUT TO REF LAB; TAT 18-24 HRS): SARS-CoV-2, NAA: NOT DETECTED

## 2019-11-25 IMAGING — CR CHEST - 2 VIEW
2 series · 2 of 2 positions shown · non-contrast
Comparison: Prior radiograph from 02/10/2009.

CLINICAL DATA: Initial evaluation for acute chest pain.

EXAM:
CHEST - 2 VIEW

[w chest pa]
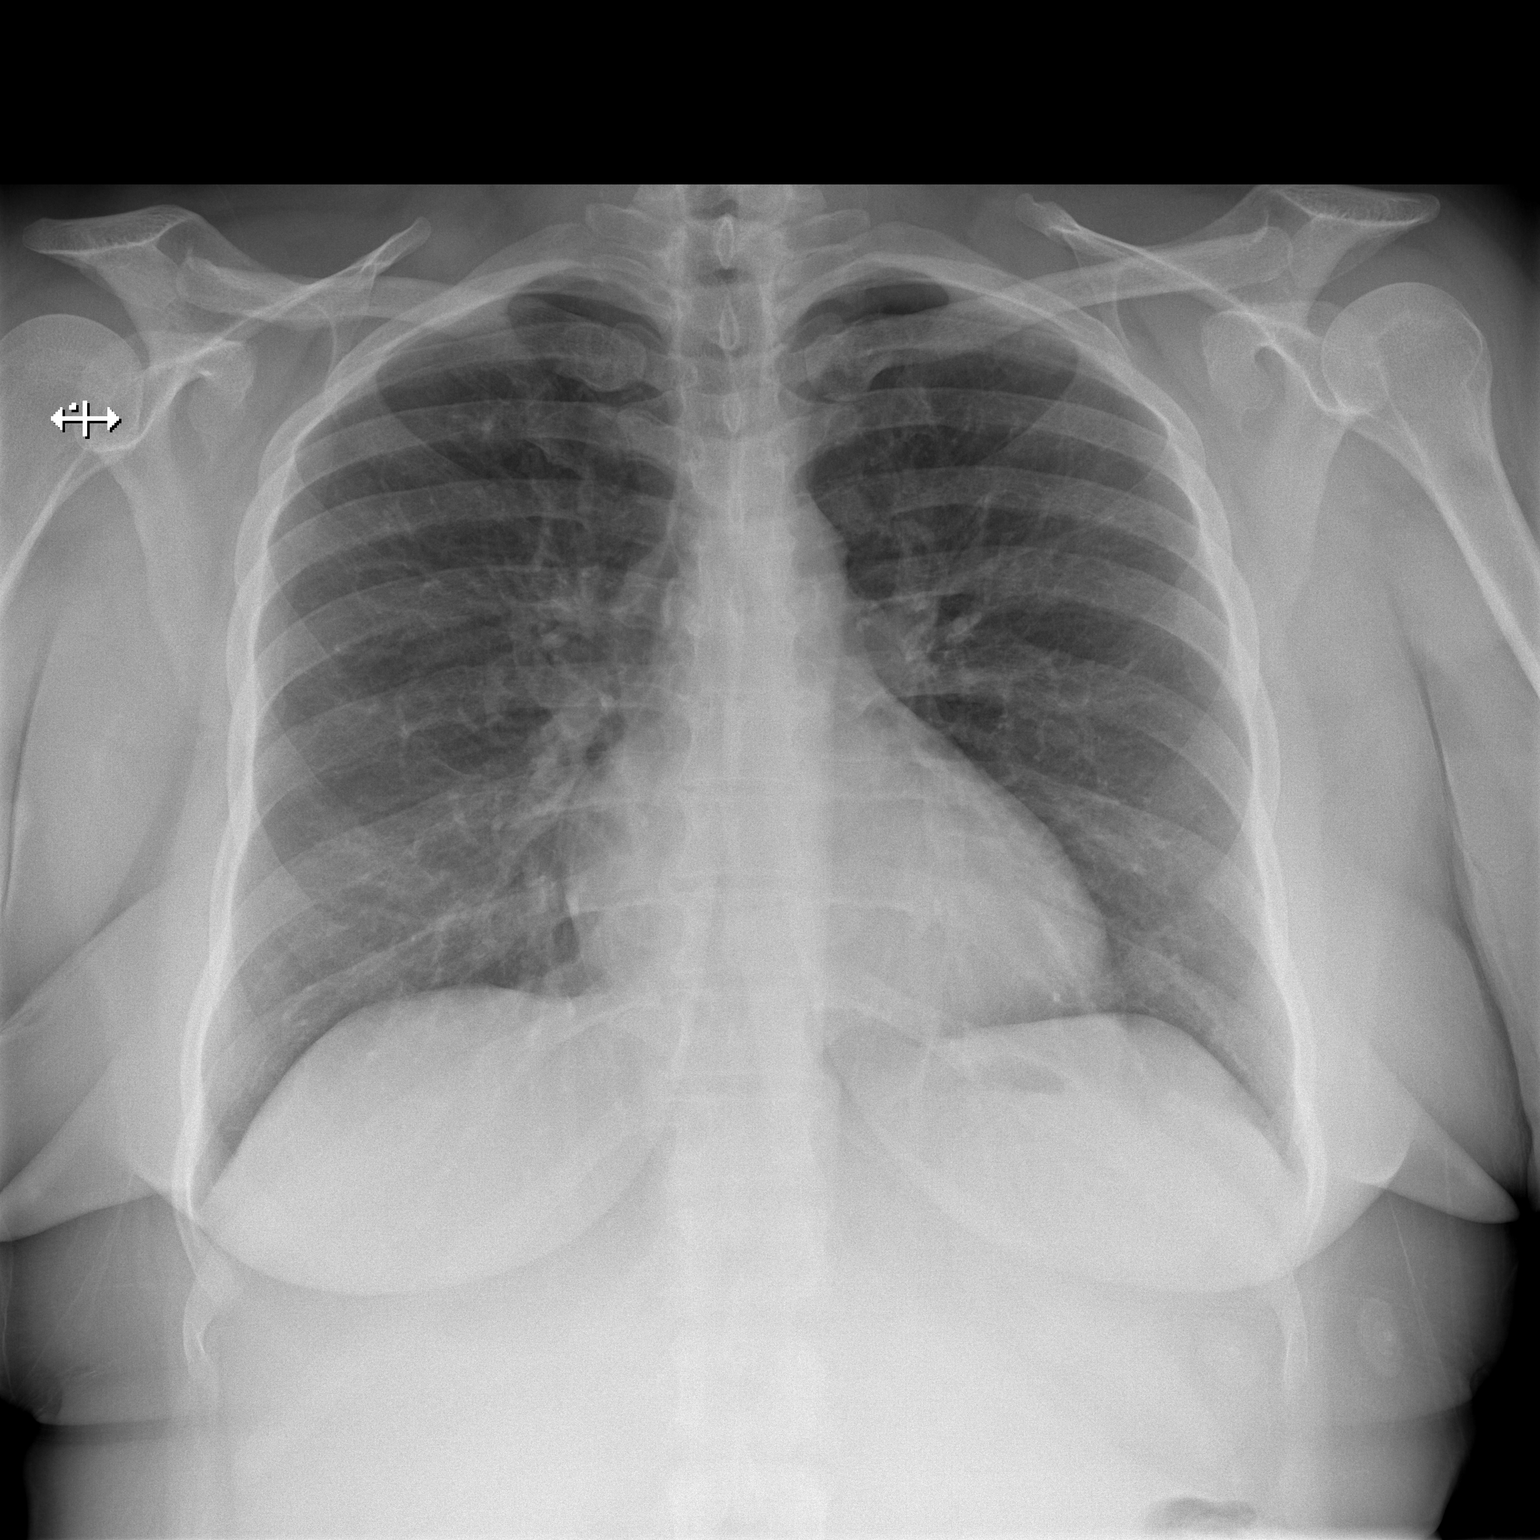

[w chest lat]
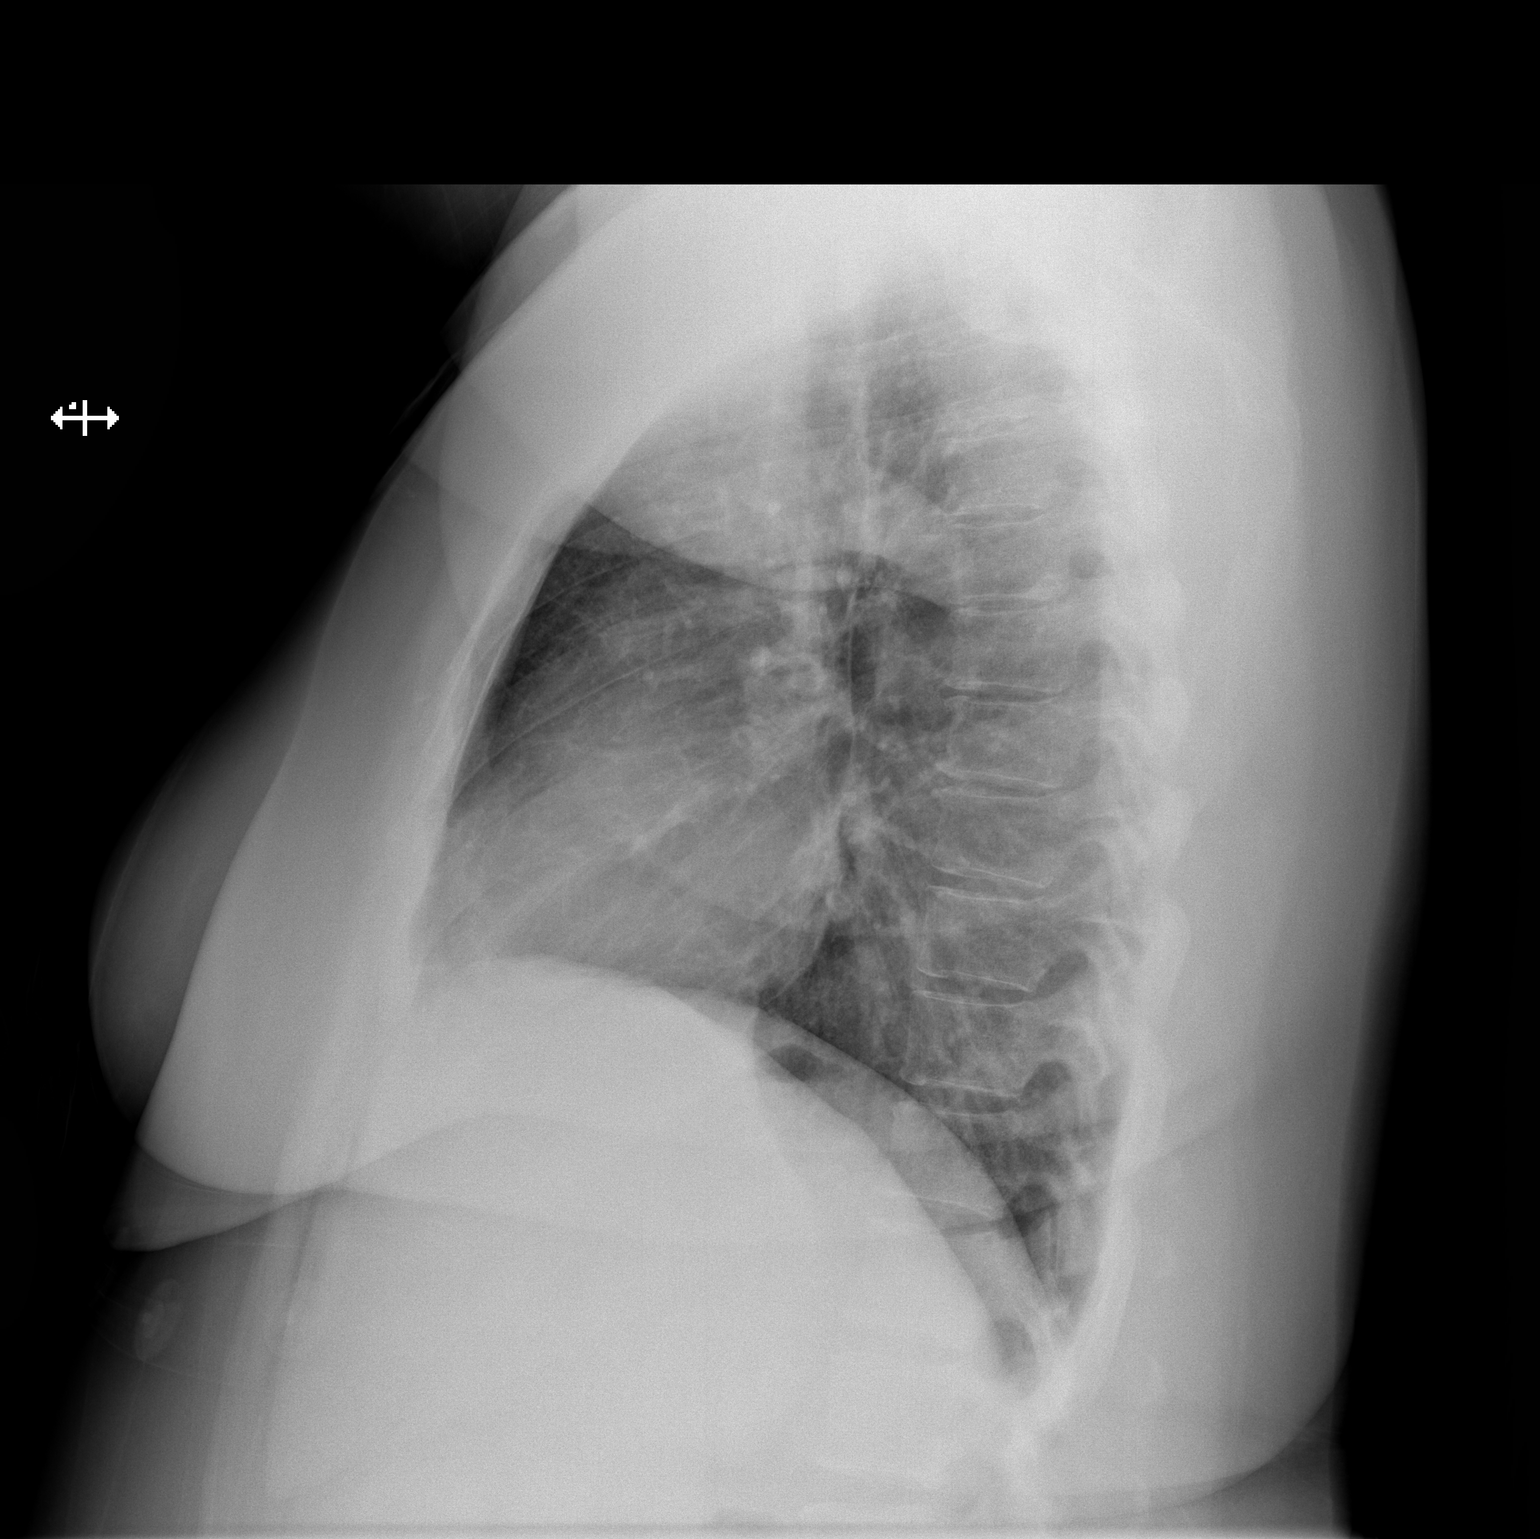

[2 of 2 positions shown; findings below may reference images not displayed]

FINDINGS: The cardiac and mediastinal silhouettes are stable in size and
contour, and remain within normal limits.

The lungs are normally inflated. No airspace consolidation, pleural
effusion, or pulmonary edema is identified. There is no
pneumothorax.

No acute osseous abnormality identified.
IMPRESSION: No active cardiopulmonary disease.
